# Patient Record
Sex: Male | Born: 1995 | Race: Black or African American | Hispanic: No | Marital: Single | Smoking: Never smoker
Health system: Southern US, Community
[De-identification: ages and names within clinical notes are randomized; demographics above are authoritative.]

---
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
## 2020-12-19 ENCOUNTER — Encounter: Payer: Self-pay | Admitting: Neurology

## 2021-02-20 NOTE — Progress Notes (Signed)
   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  Check MRI of brain with and without contrast.  Further recommendations pending results. 2.  At this time, he defers a preventative medication (antidepressant or antiepileptic).   3.  Follow up after testing.   Subjective:  Geoffrey Jimenez is a 25 year old left-handed male who presents for headaches.  History supplemented by ENT and referring provider's notes.  He started having visual disturbance in 2020.  He sees very tiny clear dots all over his vision of both eyes.   It is constant.  Saw ophthalmology who said exam was normal.  He has history of headaches preceding the visual symptoms.  Headaches are mild to severe, right greater than left stabbing/burning retro-orbital pain They typically last 30-120 minutes.  Associated with photophobia but no nausea, vomiting, phonophobia, autonomic symptoms, numbness or weakness.  They occur 3 to 5 times a week.  Does not treat headaches with pain relievers.   He has history of chronic globus sensation and intermittent painful swallowing as well as nasal congestion.  He was seen by ENT in December and found to have bilateral inferior turbinate hypertrophy with postnasal drainaing and laryngopharyngeal reflux  No known family history of migraines or other neurologic conditions.     PAST MEDICAL HISTORY: No past medical history on file.   PAST SURGICAL HISTORY: none  MEDICATIONS: No current outpatient medications on file prior to visit.   No current facility-administered medications on file prior to visit.    ALLERGIES: No Known Allergies  FAMILY HISTORY: No family history on file.   Objective:  Blood pressure 105/72, pulse 82, resp. rate 18, height 6' (1.829 m),  weight 196 lb (88.9 kg), SpO2 97 %. General: No acute distress.  Patient appears well-groomed.   Head:  Normocephalic/atraumatic Eyes:  fundi examined but not visualized Neck: supple, no paraspinal tenderness, full range of motion Back: No paraspinal tenderness Heart: regular rate and rhythm Lungs: Clear to auscultation bilaterally. Vascular: No carotid bruits. Neurological Exam: Mental status: alert and oriented to person, place, and time, recent and remote memory intact, fund of knowledge intact, attention and concentration intact, speech fluent and not dysarthric, language intact. Cranial nerves: CN I: not tested CN II: pupils equal, round and reactive to light, visual fields intact CN III, IV, VI:  full range of motion, no nystagmus, no ptosis CN V: facial sensation intact. CN VII: upper and lower face symmetric CN VIII: hearing intact CN IX, X: gag intact, uvula midline CN XI: sternocleidomastoid and trapezius muscles intact CN XII: tongue midline Bulk & Tone: normal, no fasciculations. Motor:  muscle strength 5/5 throughout Sensation:  Pinprick, temperature and vibratory sensation intact. Deep Tendon Reflexes:  2+ throughout,  toes downgoing.   Finger to nose testing:  Without dysmetria.   Heel to shin:  Without dysmetria.   Gait:  Normal station and stride.  Romberg negative.    Thank you for allowing me to take part in the care of this patient.  Shon Millet, DO  CC: Earlie Lou, MD

## 2021-02-21 ENCOUNTER — Encounter: Payer: Self-pay | Admitting: Neurology

## 2021-02-21 ENCOUNTER — Other Ambulatory Visit: Payer: Self-pay

## 2021-02-21 ENCOUNTER — Ambulatory Visit (INDEPENDENT_AMBULATORY_CARE_PROVIDER_SITE_OTHER): Payer: PRIVATE HEALTH INSURANCE | Admitting: Neurology

## 2021-02-21 VITALS — BP 105/72 | HR 82 | Resp 18 | Ht 72.0 in | Wt 196.0 lb

## 2021-02-21 DIAGNOSIS — H5319 Other subjective visual disturbances: Secondary | ICD-10-CM | POA: Diagnosis not present

## 2021-02-21 DIAGNOSIS — G43009 Migraine without aura, not intractable, without status migrainosus: Secondary | ICD-10-CM

## 2021-02-21 NOTE — Patient Instructions (Signed)
1.  Will check MRI of brain with and without contrast.  Further recommendations pending results. 2.  Follow up after testing.

## 2021-03-09 ENCOUNTER — Ambulatory Visit
Admission: RE | Admit: 2021-03-09 | Discharge: 2021-03-09 | Disposition: A | Discharge status: Home / Self Care | Payer: PRIVATE HEALTH INSURANCE | Source: Ambulatory Visit | Attending: Neurology | Admitting: Neurology

## 2021-03-09 ENCOUNTER — Other Ambulatory Visit: Payer: Self-pay

## 2021-03-09 MED ORDER — GADOBENATE DIMEGLUMINE 529 MG/ML IV SOLN
20.0000 mL | Freq: Once | INTRAVENOUS | Status: AC | PRN
  Administered 2021-03-09: 20 mL via INTRAVENOUS

## 2021-03-14 ENCOUNTER — Telehealth: Payer: Self-pay | Admitting: Neurology

## 2021-03-14 NOTE — Progress Notes (Signed)
Tried calling pt, No answer. LMOVM to call the office back.  

## 2021-03-14 NOTE — Telephone Encounter (Signed)
Called in wanting results and when I told him I couldn't give him any information, I had to send a message to the nurse, he got very agitated with me and said this don't make sense and hung up

## 2021-03-14 NOTE — Telephone Encounter (Signed)
Pt advise of his Mri results.

## 2021-03-14 NOTE — Progress Notes (Signed)
Pt advised of MRI results.

## 2021-03-18 ENCOUNTER — Other Ambulatory Visit: Payer: Self-pay

## 2021-03-18 ENCOUNTER — Emergency Department (HOSPITAL_COMMUNITY)
Admission: EM | Admit: 2021-03-18 | Discharge: 2021-03-19 | Discharge status: Against Medical Advice | Payer: PRIVATE HEALTH INSURANCE

## 2021-03-18 NOTE — ED Notes (Signed)
Pt called for triage. No answer x1!
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
## 2022-02-03 NOTE — Progress Notes (Signed)
? ?  NEUROLOGY FOLLOW UP OFFICE NOTE ? ?Geoffrey Jimenez ?[id number] ? ?Assessment/Plan:  ? ?Visual snow syndrome ?  ?1.  Start lamotrigine titrating to 50mg  twice daily.  Check baseline CBC and CMP for medication management. ?2.  Follow up 4-5 months. ?  ?  ?Subjective:  ?Geoffrey Jimenez is a 26 year old left-handed male who follows up for visual disturbance and migraines. ? ?UPDATE: ?Last seen in May 2022.  MRI of brain with and without contrast on 03/09/2021 personally reviewed was normal.  He saw ophthalmology in October 2022 with normal exam.  He is agreeable to starting medication for treatment. ?  ?HISTORY: ?He started having visual disturbance in 2020.  He sees very tiny clear dots all over his vision of both eyes.   It is constant.  Saw ophthalmology who said exam was normal.  He has history of headaches preceding the visual symptoms.  Headaches are mild to severe, right greater than left stabbing/burning retro-orbital pain They typically last 30-120 minutes.  Associated with photophobia but no nausea, vomiting, phonophobia, autonomic symptoms, numbness or weakness.  They occur 3 to 5 times a week.  Does not treat headaches with pain relievers. ?  ? He has history of chronic globus sensation and intermittent painful swallowing as well as nasal congestion.  He was seen by ENT in December and found to have bilateral inferior turbinate hypertrophy with postnasal drainaing and laryngopharyngeal reflux ?  ?No known family history of migraines or other neurologic conditions.   ? ?PAST MEDICAL HISTORY: ?No past medical history on file. ? ?MEDICATIONS: ?No current outpatient medications on file prior to visit.  ? ?No current facility-administered medications on file prior to visit.  ? ? ?ALLERGIES: ?No Known Allergies ? ?FAMILY HISTORY: ?No family history on file. ? ?  ?Objective:  ?Blood pressure 118/69, pulse 79, height 5\' 11"  (1.803 m), weight 181 lb 12.8 oz (82.5 kg), SpO2 99 %. ?General: No acute distress.  Patient  appears well-groomed.   ?Head:  Normocephalic/atraumatic ?Eyes:  Fundi examined but not visualized ?Heart:  Regular rate and rhythm ?Neurological Exam: alert and oriented to person, place, and time.  Speech fluent and not dysarthric, language intact.  CN II-XII intact. Bulk and tone normal, muscle strength 5/5 throughout.  Sensation to light touch intact.  Deep tendon reflexes 2+ throughout.  Finger to nose testing intact.  Gait normal, Romberg negative. ? ? ?January, DO ? ?CC: , MD ? ? ? ? ? ? ?

## 2022-02-04 ENCOUNTER — Other Ambulatory Visit (INDEPENDENT_AMBULATORY_CARE_PROVIDER_SITE_OTHER): Payer: PRIVATE HEALTH INSURANCE

## 2022-02-04 ENCOUNTER — Encounter: Payer: Self-pay | Admitting: Neurology

## 2022-02-04 ENCOUNTER — Ambulatory Visit (INDEPENDENT_AMBULATORY_CARE_PROVIDER_SITE_OTHER): Payer: PRIVATE HEALTH INSURANCE | Admitting: Neurology

## 2022-02-04 VITALS — BP 118/69 | HR 79 | Ht 71.0 in | Wt 181.8 lb

## 2022-02-04 DIAGNOSIS — H5319 Other subjective visual disturbances: Secondary | ICD-10-CM

## 2022-02-04 DIAGNOSIS — Z79899 Other long term (current) drug therapy: Secondary | ICD-10-CM

## 2022-02-04 LAB — COMPREHENSIVE METABOLIC PANEL
ALT: 19 U/L (ref 0–53)
AST: 21 U/L (ref 0–37)
Albumin: 4.6 g/dL (ref 3.5–5.2)
Alkaline Phosphatase: 96 U/L (ref 39–117)
BUN: 11 mg/dL (ref 6–23)
CO2: 31 mEq/L (ref 19–32)
Calcium: 9.7 mg/dL (ref 8.4–10.5)
Chloride: 102 mEq/L (ref 96–112)
Creatinine, Ser: 1.09 mg/dL (ref 0.40–1.50)
GFR: 95.34 mL/min (ref >60.00)
Glucose, Bld: 93 mg/dL (ref 70–99)
Potassium: 3.7 mEq/L (ref 3.5–5.1)
Sodium: 141 mEq/L (ref 135–145)
Total Bilirubin: 0.9 mg/dL (ref 0.2–1.2)
Total Protein: 7.4 g/dL (ref 6.0–8.3)

## 2022-02-04 LAB — CBC
HCT: 41.7 % (ref 39.0–52.0)
Hemoglobin: 13.8 g/dL (ref 13.0–17.0)
MCHC: 33.1 g/dL (ref 30.0–36.0)
MCV: 88.3 fl (ref 78.0–100.0)
Platelets: 168 10*3/uL (ref 150.0–400.0)
RBC: 4.73 Mil/uL (ref 4.22–5.81)
RDW: 12.8 % (ref 11.5–15.5)
WBC: 4.2 10*3/uL (ref 4.0–10.5)

## 2022-02-04 MED ORDER — LAMOTRIGINE 25 MG PO TABS: ORAL_TABLET | ORAL | 0 refills | Status: AC

## 2022-02-04 NOTE — Patient Instructions (Signed)
Start lamotrigine 25mg  tablet.  Increase dose as directed: ?  AM   PM ?Week 1&2    25mg  (1 tab) ?Week 3&4 25mg  (1 tab)  25mg  (1 tab) ?Week 5 50mg  (2 tabs)  50mg  (2 tabs) ? ?Call if you develop any new unusual rash. ? ?2.      Check routine CBC and CMP ?3.  Follow up 4-5 months. ?

## 2022-07-07 NOTE — Progress Notes (Deleted)
   NEUROLOGY FOLLOW UP OFFICE NOTE  Geoffrey Jimenez [id number]  Assessment/Plan:   Visual snow syndrome   1.  lamotrigine titrating to 50mg twice daily.   2.  Follow up ***.     Subjective:  Geoffrey Jimenez is a 26 year old left-handed male who follows up for visual disturbance and migraines.  UPDATE: Started lamotrigine last visit, currently on 50mg twice daily. ***   HISTORY: He started having visual disturbance in 2020.  He sees very tiny clear dots all over his vision of both eyes.   It is constant.  Saw ophthalmology who said exam was normal.  He has history of headaches preceding the visual symptoms.  Headaches are mild to severe, right greater than left stabbing/burning retro-orbital pain They typically last 30-120 minutes.  Associated with photophobia but no nausea, vomiting, phonophobia, autonomic symptoms, numbness or weakness.  They occur 3 to 5 times a week.  Does not treat headaches with pain relievers.  MRI of brain with and without contrast on 03/09/2021 was normal.  He saw ophthalmology in October 2022 with normal exam    He has history of chronic globus sensation and intermittent painful swallowing as well as nasal congestion.  He was seen by ENT in December and found to have bilateral inferior turbinate hypertrophy with postnasal drainaing and laryngopharyngeal reflux   No known family history of migraines or other neurologic conditions.    PAST MEDICAL HISTORY: No past medical history on file.  MEDICATIONS: ***   ALLERGIES: No Known Allergies  FAMILY HISTORY: No family history on file.    Objective:  *** General: No acute distress.  Patient appears well-groomed.   Head:  Normocephalic/atraumatic Eyes:  Fundi examined but not visualized Heart:  Regular rate and rhythm Neurological Exam:alert and oriented to person, place, and time.  Speech fluent and not dysarthric, language intact.  CN II-XII intact. Bulk and tone normal, muscle strength 5/5 throughout.   Sensation to light touch intact.  Deep tendon reflexes 2+ throughout.  Finger to nose testing intact.  Gait normal, Romberg negative.   Weston Fulco, DO  CC: Mohammad Garba, MD     

## 2022-07-08 ENCOUNTER — Ambulatory Visit: Payer: PRIVATE HEALTH INSURANCE | Admitting: Neurology

## 2022-07-08 ENCOUNTER — Encounter: Payer: Self-pay | Admitting: Neurology

## 2022-07-08 DIAGNOSIS — Z029 Encounter for administrative examinations, unspecified: Secondary | ICD-10-CM

## 2022-08-04 NOTE — Progress Notes (Deleted)
   NEUROLOGY FOLLOW UP OFFICE NOTE  Doral Ventrella [id number]  Assessment/Plan:   Visual snow syndrome   1.  lamotrigine titrating to 50mg  twice daily.   2.  Follow up ***.     Subjective:  Geoffrey Jimenez is a 26 year old left-handed male who follows up for visual disturbance and migraines.  UPDATE: Started lamotrigine last visit, currently on 50mg  twice daily. ***   HISTORY: He started having visual disturbance in 2020.  He sees very tiny clear dots all over his vision of both eyes.   It is constant.  Saw ophthalmology who said exam was normal.  He has history of headaches preceding the visual symptoms.  Headaches are mild to severe, right greater than left stabbing/burning retro-orbital pain They typically last 30-120 minutes.  Associated with photophobia but no nausea, vomiting, phonophobia, autonomic symptoms, numbness or weakness.  They occur 3 to 5 times a week.  Does not treat headaches with pain relievers.  MRI of brain with and without contrast on 03/09/2021 was normal.  He saw ophthalmology in October 2022 with normal exam    He has history of chronic globus sensation and intermittent painful swallowing as well as nasal congestion.  He was seen by ENT in December and found to have bilateral inferior turbinate hypertrophy with postnasal drainaing and laryngopharyngeal reflux   No known family history of migraines or other neurologic conditions.    PAST MEDICAL HISTORY: No past medical history on file.  MEDICATIONS: ***   ALLERGIES: No Known Allergies  FAMILY HISTORY: No family history on file.    Objective:  *** General: No acute distress.  Patient appears well-groomed.   Head:  Normocephalic/atraumatic Eyes:  Fundi examined but not visualized Heart:  Regular rate and rhythm Neurological Exam:alert and oriented to person, place, and time.  Speech fluent and not dysarthric, language intact.  CN II-XII intact. Bulk and tone normal, muscle strength 5/5 throughout.   Sensation to light touch intact.  Deep tendon reflexes 2+ throughout.  Finger to nose testing intact.  Gait normal, Romberg negative.   Metta Clines, DO  CC: Gala Romney, MD

## 2022-08-06 ENCOUNTER — Ambulatory Visit: Payer: PRIVATE HEALTH INSURANCE | Admitting: Neurology

## 2022-08-06 ENCOUNTER — Encounter: Payer: Self-pay | Admitting: Neurology

## 2022-08-06 DIAGNOSIS — Z029 Encounter for administrative examinations, unspecified: Secondary | ICD-10-CM

## 2024-10-21 ENCOUNTER — Encounter (HOSPITAL_COMMUNITY): Payer: Self-pay

## 2024-10-21 ENCOUNTER — Emergency Department (HOSPITAL_COMMUNITY)
Admission: EM | Admit: 2024-10-21 | Discharge: 2024-10-22 | Disposition: A | Discharge status: Home / Self Care | Attending: Emergency Medicine | Admitting: Emergency Medicine

## 2024-10-21 DIAGNOSIS — Y92410 Unspecified street and highway as the place of occurrence of the external cause: Secondary | ICD-10-CM | POA: Insufficient documentation

## 2024-10-21 DIAGNOSIS — S060X0A Concussion without loss of consciousness, initial encounter: Secondary | ICD-10-CM | POA: Diagnosis not present

## 2024-10-21 DIAGNOSIS — M545 Low back pain, unspecified: Secondary | ICD-10-CM | POA: Diagnosis not present

## 2024-10-21 DIAGNOSIS — S0990XA Unspecified injury of head, initial encounter: Secondary | ICD-10-CM | POA: Diagnosis present
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
## 2024-10-22 ENCOUNTER — Emergency Department (HOSPITAL_COMMUNITY)

## 2024-10-22 MED ORDER — ACETAMINOPHEN 500 MG PO TABS
1000.0000 mg | ORAL_TABLET | Freq: Once | ORAL | Status: AC
  Administered 2024-10-22: 1000 mg via ORAL
  Filled 2024-10-22: qty 2
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
## 2024-10-22 NOTE — Discharge Instructions (Signed)
 You were evaluated in the Emergency Department and after careful evaluation, we did not find any emergent condition requiring admission or further testing in the hospital.  Your exam/testing today is overall reassuring.  Imaging did not show signs of significant injury.  Continue Tylenol  and Motrin at home for discomfort.  Recommend mental and physical rest as we discussed for your possible concussion.  Please return to the Emergency Department if you experience any worsening of your condition.   Thank you for allowing us  to be a part of your care.
